# Patient Record
Sex: Female | Born: 1985 | Race: Black or African American | Hispanic: No | Marital: Single | Smoking: Never smoker
Health system: Southern US, Community
[De-identification: ages and names within clinical notes are randomized; demographics above are authoritative.]

## PROBLEM LIST (undated history)

## (undated) DIAGNOSIS — F419 Anxiety disorder, unspecified: Secondary | ICD-10-CM

## (undated) DIAGNOSIS — D649 Anemia, unspecified: Secondary | ICD-10-CM

## (undated) DIAGNOSIS — R51 Headache: Secondary | ICD-10-CM

## (undated) DIAGNOSIS — D759 Disease of blood and blood-forming organs, unspecified: Secondary | ICD-10-CM

## (undated) DIAGNOSIS — I10 Essential (primary) hypertension: Secondary | ICD-10-CM

## (undated) DIAGNOSIS — R519 Headache, unspecified: Secondary | ICD-10-CM

---

## 2006-06-18 ENCOUNTER — Emergency Department (HOSPITAL_COMMUNITY): Admission: EM | Admit: 2006-06-18 | Discharge: 2006-06-19 | Payer: Self-pay | Admitting: Emergency Medicine

## 2008-06-13 ENCOUNTER — Emergency Department (HOSPITAL_COMMUNITY): Admission: EM | Admit: 2008-06-13 | Discharge: 2008-06-13 | Payer: Self-pay | Admitting: Emergency Medicine

## 2009-10-15 ENCOUNTER — Emergency Department (HOSPITAL_COMMUNITY): Admission: EM | Admit: 2009-10-15 | Discharge: 2009-10-15 | Payer: Self-pay | Admitting: Emergency Medicine

## 2010-12-18 LAB — URINALYSIS, ROUTINE W REFLEX MICROSCOPIC
Glucose, UA: NEGATIVE mg/dL
Hgb urine dipstick: NEGATIVE
Specific Gravity, Urine: >1.03 — ABNORMAL HIGH (ref 1.005–1.030)

## 2010-12-18 LAB — GLUCOSE, CAPILLARY: Glucose-Capillary: 104 mg/dL — ABNORMAL HIGH (ref 70–99)

## 2010-12-18 LAB — RPR: RPR Ser Ql: NONREACTIVE

## 2010-12-18 LAB — WET PREP, GENITAL: Yeast Wet Prep HPF POC: NONE SEEN

## 2010-12-18 LAB — GC/CHLAMYDIA PROBE AMP, GENITAL
Chlamydia, DNA Probe: NEGATIVE
GC Probe Amp, Genital: NEGATIVE

## 2011-07-05 LAB — PREGNANCY, URINE: Preg Test, Ur: NEGATIVE

## 2011-07-05 LAB — URINALYSIS, ROUTINE W REFLEX MICROSCOPIC
Glucose, UA: NEGATIVE
Nitrite: NEGATIVE
Protein, ur: NEGATIVE

## 2011-07-05 LAB — GC/CHLAMYDIA PROBE AMP, GENITAL
Chlamydia, DNA Probe: NEGATIVE
GC Probe Amp, Genital: NEGATIVE

## 2011-07-05 LAB — WET PREP, GENITAL

## 2014-06-09 ENCOUNTER — Emergency Department (HOSPITAL_COMMUNITY)
Admission: EM | Admit: 2014-06-09 | Discharge: 2014-06-09 | Disposition: A | Discharge status: Home / Self Care | Payer: BC Managed Care – PPO | Attending: Emergency Medicine | Admitting: Emergency Medicine

## 2014-06-09 ENCOUNTER — Encounter (HOSPITAL_COMMUNITY): Payer: Self-pay | Admitting: Emergency Medicine

## 2014-06-09 DIAGNOSIS — S199XXA Unspecified injury of neck, initial encounter: Secondary | ICD-10-CM

## 2014-06-09 DIAGNOSIS — X58XXXA Exposure to other specified factors, initial encounter: Secondary | ICD-10-CM | POA: Insufficient documentation

## 2014-06-09 DIAGNOSIS — S0993XA Unspecified injury of face, initial encounter: Secondary | ICD-10-CM | POA: Insufficient documentation

## 2014-06-09 DIAGNOSIS — S01309A Unspecified open wound of unspecified ear, initial encounter: Secondary | ICD-10-CM | POA: Insufficient documentation

## 2014-06-09 DIAGNOSIS — Y9289 Other specified places as the place of occurrence of the external cause: Secondary | ICD-10-CM | POA: Insufficient documentation

## 2014-06-09 DIAGNOSIS — Y9389 Activity, other specified: Secondary | ICD-10-CM | POA: Diagnosis not present

## 2014-06-09 DIAGNOSIS — S0991XA Unspecified injury of ear, initial encounter: Secondary | ICD-10-CM

## 2014-06-09 NOTE — Discharge Instructions (Signed)
Please cleanse the left earlobe laceration site with soap and water. Please see a plastic surgeon soon for evaluation and management of the splint.

## 2014-06-09 NOTE — ED Provider Notes (Signed)
CSN: [id number]     Arrival date & time 06/09/14  1813 History   None    Chief Complaint  Patient presents with  . Ear Laceration     (Consider location/radiation/quality/duration/timing/severity/associated sxs/prior Treatment) HPI Comments: Pt reports the ear ring split the left ear lobe. This  Episode occurred today. Patient states she has had problems with her left ear in the past. She hasn't had a well-healed split area of the low in the past. Today she was wearing a heavy or hearing, and the split extended through the low. The patient states that she had mild bleeding present. This has now resolved. The patient presents to the emergency department to see what can be done concerning the splint in her earlobe.  The history is provided by the patient.    History reviewed. No pertinent past medical history. History reviewed. No pertinent past surgical history. History reviewed. No pertinent family history. History  Substance Use Topics  . Smoking status: Never Smoker   . Smokeless tobacco: Not on file  . Alcohol Use: No   OB History   Grav Para Term Preterm Abortions TAB SAB Ect Mult Living                 Review of Systems  Constitutional: Negative for activity change.       All ROS Neg except as noted in HPI  HENT: Negative for nosebleeds.   Eyes: Negative for photophobia and discharge.  Respiratory: Negative for cough, shortness of breath and wheezing.   Cardiovascular: Negative for chest pain and palpitations.  Gastrointestinal: Negative for abdominal pain and blood in stool.  Genitourinary: Negative for dysuria, frequency and hematuria.  Musculoskeletal: Negative for arthralgias, back pain and neck pain.  Skin: Negative.   Neurological: Negative for dizziness, seizures and speech difficulty.  Psychiatric/Behavioral: Negative for hallucinations and confusion.      Allergies  Review of patient's allergies indicates no known allergies.  Home Medications   Prior to  Admission medications   Not on File   BP 154/82  Pulse 80  Temp(Src) 99.1 F (37.3 C) (Oral)  Resp 18  Ht 5\' 4"  (1.626 m)  SpO2 100%  LMP 06/09/2014 Physical Exam  Nursing note and vitals reviewed. Constitutional: She is oriented to person, place, and time. She appears well-developed and well-nourished.  Non-toxic appearance.  HENT:  Head: Normocephalic.  Right Ear: Tympanic membrane and external ear normal.  Left Ear: Tympanic membrane and external ear normal.  Patient has a well-healed split in the left earlobe. The very small very thin area of tissue that was holding the split together has now been ruptured. There is a small scab present at the rupture site. There is no active bleeding.. There is no hematoma of the earlobe. There is no swollen glands of the preauricular or postauricular area of the ear.  Eyes: EOM and lids are normal. Pupils are equal, round, and reactive to light.  Neck: Normal range of motion. Neck supple. Carotid bruit is not present.  Cardiovascular: Normal rate, regular rhythm, normal heart sounds, intact distal pulses and normal pulses.   Pulmonary/Chest: Breath sounds normal. No respiratory distress.  Abdominal: Soft. Bowel sounds are normal. There is no tenderness. There is no guarding.  Musculoskeletal: Normal range of motion.  Lymphadenopathy:       Head (right side): No submandibular adenopathy present.       Head (left side): No submandibular adenopathy present.    She has no cervical adenopathy.  Neurological:  She is alert and oriented to person, place, and time. She has normal strength. No cranial nerve deficit or sensory deficit.  Skin: Skin is warm and dry.  Psychiatric: She has a normal mood and affect. Her speech is normal.        ED Course  Procedures (including critical care time) Labs Review Labs Reviewed - No data to display  Imaging Review No results found.   EKG Interpretation None      MDM  Patient had a split in the  earlobe in the past that had well healed. There was obviously a very thin area of tissue that was holding the split together and it ruptured today when the patient was wearing a heavy air pair of earrings.  I have suggested to the patient that she be seen by plastic surgery for possible repair of the lobe and to assist her with options concerning this injury. The patient is in agreement with this discharge plan.    Final diagnoses:  None    **I have reviewed nursing notes, vital signs, and all appropriate lab and imaging results for this patient.Lenox Ahr, PA-C 06/09/14 [place], PA-C 06/09/14 1926

## 2014-06-09 NOTE — ED Notes (Signed)
Pt states ear ring split left ear lobe. Bleeding is not present at this time. Nad.

## 2014-06-10 NOTE — ED Provider Notes (Signed)
Medical screening examination/treatment/procedure(s) were performed by non-physician practitioner and as supervising physician I was immediately available for consultation/collaboration.    Dorie Rank, MD 06/10/14 [phone]

## 2014-09-11 ENCOUNTER — Emergency Department (HOSPITAL_COMMUNITY)
Admission: EM | Admit: 2014-09-11 | Discharge: 2014-09-12 | Disposition: A | Discharge status: Home / Self Care | Payer: BC Managed Care – PPO | Attending: Emergency Medicine | Admitting: Emergency Medicine

## 2014-09-11 ENCOUNTER — Encounter (HOSPITAL_COMMUNITY): Payer: Self-pay | Admitting: Emergency Medicine

## 2014-09-11 ENCOUNTER — Emergency Department (HOSPITAL_COMMUNITY): Payer: BC Managed Care – PPO

## 2014-09-11 DIAGNOSIS — Z3202 Encounter for pregnancy test, result negative: Secondary | ICD-10-CM | POA: Diagnosis not present

## 2014-09-11 DIAGNOSIS — N8329 Other ovarian cysts: Secondary | ICD-10-CM | POA: Insufficient documentation

## 2014-09-11 DIAGNOSIS — Z720 Tobacco use: Secondary | ICD-10-CM | POA: Insufficient documentation

## 2014-09-11 DIAGNOSIS — R1030 Lower abdominal pain, unspecified: Secondary | ICD-10-CM | POA: Insufficient documentation

## 2014-09-11 DIAGNOSIS — N83209 Unspecified ovarian cyst, unspecified side: Secondary | ICD-10-CM

## 2014-09-11 DIAGNOSIS — R102 Pelvic and perineal pain: Secondary | ICD-10-CM | POA: Diagnosis present

## 2014-09-11 LAB — CBC WITH DIFFERENTIAL/PLATELET
Basophils Absolute: 0 10*3/uL (ref 0.0–0.1)
Basophils Relative: 0 % (ref 0–1)
Eosinophils Absolute: 0.3 10*3/uL (ref 0.0–0.7)
Eosinophils Relative: 3 % (ref 0–5)
HEMATOCRIT: 38.3 % (ref 36.0–46.0)
Hemoglobin: 12.8 g/dL (ref 12.0–15.0)
LYMPHS ABS: 3.4 10*3/uL (ref 0.7–4.0)
LYMPHS PCT: 37 % (ref 12–46)
MCH: 29.4 pg (ref 26.0–34.0)
MCHC: 33.4 g/dL (ref 30.0–36.0)
MCV: 87.8 fL (ref 78.0–100.0)
MONOS PCT: 7 % (ref 3–12)
Monocytes Absolute: 0.6 10*3/uL (ref 0.1–1.0)
NEUTROS ABS: 5 10*3/uL (ref 1.7–7.7)
Neutrophils Relative %: 53 % (ref 43–77)
PLATELETS: 304 10*3/uL (ref 150–400)
RBC: 4.36 MIL/uL (ref 3.87–5.11)
RDW: 13 % (ref 11.5–15.5)
WBC: 9.4 10*3/uL (ref 4.0–10.5)

## 2014-09-11 LAB — URINALYSIS, ROUTINE W REFLEX MICROSCOPIC
BILIRUBIN URINE: NEGATIVE
Glucose, UA: NEGATIVE mg/dL
Ketones, ur: NEGATIVE mg/dL
Leukocytes, UA: NEGATIVE
NITRITE: NEGATIVE
PROTEIN: NEGATIVE mg/dL
UROBILINOGEN UA: 0.2 mg/dL (ref 0.0–1.0)
pH: 6 (ref 5.0–8.0)

## 2014-09-11 LAB — COMPREHENSIVE METABOLIC PANEL
ALK PHOS: 110 U/L (ref 39–117)
ALT: 20 U/L (ref 0–35)
ANION GAP: 12 (ref 5–15)
AST: 19 U/L (ref 0–37)
Albumin: 3.2 g/dL — ABNORMAL LOW (ref 3.5–5.2)
BILIRUBIN TOTAL: 0.2 mg/dL — AB (ref 0.3–1.2)
BUN: 10 mg/dL (ref 6–23)
CHLORIDE: 103 meq/L (ref 96–112)
CO2: 23 meq/L (ref 19–32)
Calcium: 9.2 mg/dL (ref 8.4–10.5)
Creatinine, Ser: 1.03 mg/dL (ref 0.50–1.10)
GFR calc non Af Amer: 73 mL/min — ABNORMAL LOW (ref >90)
GFR, EST AFRICAN AMERICAN: 84 mL/min — AB (ref >90)
GLUCOSE: 96 mg/dL (ref 70–99)
POTASSIUM: 4 meq/L (ref 3.7–5.3)
Sodium: 138 mEq/L (ref 137–147)
Total Protein: 7.1 g/dL (ref 6.0–8.3)

## 2014-09-11 LAB — PREGNANCY, URINE: PREG TEST UR: NEGATIVE

## 2014-09-11 LAB — URINE MICROSCOPIC-ADD ON

## 2014-09-11 MED ORDER — HYDROCODONE-ACETAMINOPHEN 5-325 MG PO TABS: 1.0000 | ORAL_TABLET | ORAL | Status: DC | PRN

## 2014-09-11 MED ORDER — MORPHINE SULFATE 4 MG/ML IJ SOLN
6.0000 mg | Freq: Once | INTRAMUSCULAR | Status: AC
  Administered 2014-09-11: 6 mg via INTRAVENOUS
  Filled 2014-09-11: qty 2

## 2014-09-11 MED ORDER — IOHEXOL 300 MG/ML  SOLN
50.0000 mL | Freq: Once | INTRAMUSCULAR | Status: AC | PRN
  Administered 2014-09-11: 50 mL via ORAL

## 2014-09-11 MED ORDER — IOHEXOL 300 MG/ML  SOLN
100.0000 mL | Freq: Once | INTRAMUSCULAR | Status: AC | PRN
  Administered 2014-09-11: 100 mL via INTRAVENOUS

## 2014-09-11 MED ORDER — ONDANSETRON 8 MG PO TBDP: 8.0000 mg | ORAL_TABLET | Freq: Three times a day (TID) | ORAL | Status: DC | PRN

## 2014-09-11 NOTE — ED Notes (Signed)
Radiology called to call in ultrasound to rule out torsion.

## 2014-09-11 NOTE — ED Provider Notes (Signed)
CSN: [id number]     Arrival date & time 09/11/14  2009 History   First MD Initiated Contact with Patient 09/11/14 2037     Chief Complaint  Patient presents with  . Pelvic Pain      HPI Patient reports 5 days of lower abdominal pelvic pain.  No dysuria or urinary frequency.  Denies vaginal discharge.  Denies nausea vomiting and diarrhea.  Reports her pain is been constant.  She was seen by her gynecologist several days ago was told she had a urinary tract infection.  She continues to feel uncomfortable and thus she presents to the ER for evaluation.  She reports menstrual bleeding several days ago that seems to be lightening up at this time.   History reviewed. No pertinent past medical history. History reviewed. No pertinent past surgical history. History reviewed. No pertinent family history. History  Substance Use Topics  . Smoking status: Never Smoker   . Smokeless tobacco: Not on file  . Alcohol Use: No   OB History    No data available     Review of Systems  All other systems reviewed and are negative.     Allergies  Review of patient's allergies indicates no known allergies.  Home Medications   Prior to Admission medications   Medication Sig Start Date End Date Taking? Authorizing Provider  levofloxacin (LEVAQUIN) 500 MG tablet Take 500 mg by mouth daily. 7 days course starting 09/09/2014   Yes Historical Provider, MD   BP 137/89 mmHg  Pulse 97  Temp(Src) 98.5 F (36.9 C) (Oral)  Resp 18  Ht 5' 3.5" (1.613 m)  Wt 220 lb (99.791 kg)  BMI 38.36 kg/m2  SpO2 98%  LMP 09/08/2014 Physical Exam  Constitutional: She is oriented to person, place, and time. She appears well-developed and well-nourished. No distress.  HENT:  Head: Normocephalic and atraumatic.  Eyes: EOM are normal.  Neck: Normal range of motion.  Cardiovascular: Normal rate, regular rhythm and normal heart sounds.   Pulmonary/Chest: Effort normal and breath sounds normal.  Abdominal: Soft. She  exhibits no distension. There is no tenderness.  Musculoskeletal: Normal range of motion.  Neurological: She is alert and oriented to person, place, and time.  Skin: Skin is warm and dry.  Psychiatric: She has a normal mood and affect. Judgment normal.  Nursing note and vitals reviewed.   ED Course  Procedures (including critical care time) Labs Review Labs Reviewed  URINALYSIS, ROUTINE W REFLEX MICROSCOPIC - Abnormal; Notable for the following:    Specific Gravity, Urine >1.030 (*)    Hgb urine dipstick MODERATE (*)    All other components within normal limits  COMPREHENSIVE METABOLIC PANEL - Abnormal; Notable for the following:    Albumin 3.2 (*)    Total Bilirubin 0.2 (*)    GFR calc non Af Amer 73 (*)    GFR calc Af Amer 84 (*)    All other components within normal limits  PREGNANCY, URINE  CBC WITH DIFFERENTIAL  URINE MICROSCOPIC-ADD ON    Imaging Review No results found.   EKG Interpretation None      MDM   Final diagnoses:  Lower abdominal pain    Patient will undergo CT imaging of her lower abdomen given her ongoing lower abdominal pain for the past 4-5 days.  She has no vaginal complaints and no vaginal discharge and my suspicion for pelvic inflammatory disease is very low.  If her CT scan is normal the patient can follow-up closely with  her physicians.  Her urine appears to have cleared.  She will complete her course of Levaquin. Care to Dr Ashok Cordia to follow up on her CT imaging. Pt aware of discharge planning if CT negative    Hoy Morn, MD 09/11/14 2149

## 2014-09-11 NOTE — ED Notes (Signed)
Pt states lower abdominal/pelvic pain. Denies any increase in frequency or pain w/ urination. Pt denies any vaginal discharge, N/V/D. Last bowel movement was today.

## 2014-09-11 NOTE — ED Notes (Addendum)
Patient reports pelvic pain that started Sunday night. States was seen Tuesday and given medications for UTI. States symptoms have gotten worse. Denies vaginal discharge.

## 2014-09-12 ENCOUNTER — Emergency Department (HOSPITAL_COMMUNITY): Payer: BC Managed Care – PPO

## 2014-09-12 MED ORDER — HYDROCODONE-ACETAMINOPHEN 5-325 MG PO TABS: 1.0000 | ORAL_TABLET | ORAL | Status: DC | PRN

## 2014-09-12 NOTE — ED Provider Notes (Signed)
Signed out by Dr Venora Maples at 2200 to check ct when back.  Ct with 6.5 cm left ovarian cyst.  Pt notes worsening left pelvis pain in the past day, and constant for past few days. Will get u/s r/o torsion.  0030, u/s pending, signed out to Dr Roxanne Mins to check u/s when back and dispo appropriately.      Mirna Mires, MD 09/12/14 [phone]

## 2014-09-12 NOTE — Discharge Instructions (Signed)
Ovarian Cyst An ovarian cyst is a fluid-filled sac that forms on an ovary. The ovaries are small organs that produce eggs in women. Various types of cysts can form on the ovaries. Most are not cancerous. Many do not cause problems, and they often go away on their own. Some may cause symptoms and require treatment. Common types of ovarian cysts include:  Functional cysts--These cysts may occur every month during the menstrual cycle. This is normal. The cysts usually go away with the next menstrual cycle if the woman does not get pregnant. Usually, there are no symptoms with a functional cyst.  Endometrioma cysts--These cysts form from the tissue that lines the uterus. They are also called "chocolate cysts" because they become filled with blood that turns brown. This type of cyst can cause pain in the lower abdomen during intercourse and with your menstrual period.  Cystadenoma cysts--This type develops from the cells on the outside of the ovary. These cysts can get very big and cause lower abdomen pain and pain with intercourse. This type of cyst can twist on itself, cut off its blood supply, and cause severe pain. It can also easily rupture and cause a lot of pain.  Dermoid cysts--This type of cyst is sometimes found in both ovaries. These cysts may contain different kinds of body tissue, such as skin, teeth, hair, or cartilage. They usually do not cause symptoms unless they get very big.  Theca lutein cysts--These cysts occur when too much of a certain hormone (human chorionic gonadotropin) is produced and overstimulates the ovaries to produce an egg. This is most common after procedures used to assist with the conception of a baby (in vitro fertilization). CAUSES   Fertility drugs can cause a condition in which multiple large cysts are formed on the ovaries. This is called ovarian hyperstimulation syndrome.  A condition called polycystic ovary syndrome can cause hormonal imbalances that can lead to  nonfunctional ovarian cysts. SIGNS AND SYMPTOMS  Many ovarian cysts do not cause symptoms. If symptoms are present, they may include:  Pelvic pain or pressure.  Pain in the lower abdomen.  Pain during sexual intercourse.  Increasing girth (swelling) of the abdomen.  Abnormal menstrual periods.  Increasing pain with menstrual periods.  Stopping having menstrual periods without being pregnant. DIAGNOSIS  These cysts are commonly found during a routine or annual pelvic exam. Tests may be ordered to find out more about the cyst. These tests may include:  Ultrasound.  X-ray of the pelvis.  CT scan.  MRI.  Blood tests. TREATMENT  Many ovarian cysts go away on their own without treatment. Your health care provider may want to check your cyst regularly for 2-3 months to see if it changes. For women in menopause, it is particularly important to monitor a cyst closely because of the higher rate of ovarian cancer in menopausal women. When treatment is needed, it may include any of the following:  A procedure to drain the cyst (aspiration). This may be done using a long needle and ultrasound. It can also be done through a laparoscopic procedure. This involves using a thin, lighted tube with a tiny camera on the end (laparoscope) inserted through a small incision.  Surgery to remove the whole cyst. This may be done using laparoscopic surgery or an open surgery involving a larger incision in the lower abdomen.  Hormone treatment or birth control pills. These methods are sometimes used to help dissolve a cyst. HOME CARE INSTRUCTIONS   Only take over-the-counter   or prescription medicines as directed by your health care provider.  Follow up with your health care provider as directed.  Get regular pelvic exams and Pap tests. SEEK MEDICAL CARE IF:   Your periods are late, irregular, or painful, or they stop.  Your pelvic pain or abdominal pain does not go away.  Your abdomen becomes  larger or swollen.  You have pressure on your bladder or trouble emptying your bladder completely.  You have pain during sexual intercourse.  You have feelings of fullness, pressure, or discomfort in your stomach.  You lose weight for no apparent reason.  You feel generally ill.  You become constipated.  You lose your appetite.  You develop acne.  You have an increase in body and facial hair.  You are gaining weight, without changing your exercise and eating habits.  You think you are pregnant. SEEK IMMEDIATE MEDICAL CARE IF:   You have increasing abdominal pain.  You feel sick to your stomach (nauseous), and you throw up (vomit).  You develop a fever that comes on suddenly.  You have abdominal pain during a bowel movement.  Your menstrual periods become heavier than usual. MAKE SURE YOU:  Understand these instructions.  Will watch your condition.  Will get help right away if you are not doing well or get worse. Document Released: 09/18/2005 Document Revised: 09/23/2013 Document Reviewed: 05/26/2013 Exit[employer] Patient Information 2015 [employer]. This information is not intended to replace advice given to you by your health care provider. Make sure you discuss any questions you have with your health care provider.  

## 2014-09-12 NOTE — ED Provider Notes (Signed)
Patient initially seen by Dr. Venora Maples, signed out to me by Dr. spinal to evaluate pelvic ultrasound. CT had shown left ovarian mass, probable cyst. Ultrasound did not show the ovaries at all. Patient is referred back to her gynecologist for further evaluation. She is discharged with prescription for hydrocodone-acetaminophen for pain.  Mary Fuel, MD 14/23/95 3202

## 2014-09-28 MED FILL — Hydrocodone-Acetaminophen Tab 5-325 MG: ORAL | Qty: 6 | Status: AC

## 2016-02-09 ENCOUNTER — Emergency Department (HOSPITAL_COMMUNITY)
Admission: EM | Admit: 2016-02-09 | Discharge: 2016-02-09 | Disposition: A | Discharge status: Home / Self Care | Payer: BLUE CROSS/BLUE SHIELD | Attending: Emergency Medicine | Admitting: Emergency Medicine

## 2016-02-09 ENCOUNTER — Encounter (HOSPITAL_COMMUNITY): Payer: Self-pay | Admitting: Emergency Medicine

## 2016-02-09 DIAGNOSIS — S161XXA Strain of muscle, fascia and tendon at neck level, initial encounter: Secondary | ICD-10-CM | POA: Diagnosis not present

## 2016-02-09 DIAGNOSIS — Y999 Unspecified external cause status: Secondary | ICD-10-CM | POA: Diagnosis not present

## 2016-02-09 DIAGNOSIS — Y92410 Unspecified street and highway as the place of occurrence of the external cause: Secondary | ICD-10-CM | POA: Insufficient documentation

## 2016-02-09 DIAGNOSIS — Y939 Activity, unspecified: Secondary | ICD-10-CM | POA: Insufficient documentation

## 2016-02-09 DIAGNOSIS — Z79891 Long term (current) use of opiate analgesic: Secondary | ICD-10-CM | POA: Insufficient documentation

## 2016-02-09 DIAGNOSIS — Z79899 Other long term (current) drug therapy: Secondary | ICD-10-CM | POA: Diagnosis not present

## 2016-02-09 DIAGNOSIS — Z792 Long term (current) use of antibiotics: Secondary | ICD-10-CM | POA: Insufficient documentation

## 2016-02-09 DIAGNOSIS — Z791 Long term (current) use of non-steroidal anti-inflammatories (NSAID): Secondary | ICD-10-CM | POA: Diagnosis not present

## 2016-02-09 DIAGNOSIS — S199XXA Unspecified injury of neck, initial encounter: Secondary | ICD-10-CM | POA: Diagnosis not present

## 2016-02-09 MED ORDER — NAPROXEN 500 MG PO TABS
500.0000 mg | ORAL_TABLET | Freq: Once | ORAL | Status: AC
Start: 2016-02-09 — End: 2016-02-09
  Administered 2016-02-09: 500 mg via ORAL
  Filled 2016-02-09: qty 1

## 2016-02-09 MED ORDER — NAPROXEN 500 MG PO TABS: 500.0000 mg | ORAL_TABLET | Freq: Two times a day (BID) | ORAL | Status: DC

## 2016-02-09 MED ORDER — CYCLOBENZAPRINE HCL 10 MG PO TABS: 10.0000 mg | ORAL_TABLET | Freq: Two times a day (BID) | ORAL | Status: DC | PRN
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
## 2016-02-09 NOTE — Discharge Instructions (Signed)
Cervical Strain and Sprain With Rehab  Cervical strain and sprain are injuries that commonly occur with "whiplash" injuries. Whiplash occurs when the neck is forcefully whipped backward or forward, such as during a motor vehicle accident or during contact sports. The muscles, ligaments, tendons, discs, and nerves of the neck are susceptible to injury when this occurs.  RISK FACTORS  Risk of having a whiplash injury increases if:  · Osteoarthritis of the spine.  · Situations that make head or neck accidents or trauma more likely.  · High-risk sports (football, rugby, wrestling, hockey, auto racing, gymnastics, diving, contact karate, or boxing).  · Poor strength and flexibility of the neck.  · Previous neck injury.  · Poor tackling technique.  · Improperly fitted or padded equipment.  SYMPTOMS   · Pain or stiffness in the front or back of neck or both.  · Symptoms may present immediately or up to 24 hours after injury.  · Dizziness, headache, nausea, and vomiting.  · Muscle spasm with soreness and stiffness in the neck.  · Tenderness and swelling at the injury site.  PREVENTION  · Learn and use proper technique (avoid tackling with the head, spearing, and head-butting; use proper falling techniques to avoid landing on the head).  · Warm up and stretch properly before activity.  · Maintain physical fitness:    Strength, flexibility, and endurance.    Cardiovascular fitness.  · Wear properly fitted and padded protective equipment, such as padded soft collars, for participation in contact sports.  PROGNOSIS   Recovery from cervical strain and sprain injuries is dependent on the extent of the injury. These injuries are usually curable in 1 week to 3 months with appropriate treatment.   RELATED COMPLICATIONS   · Temporary numbness and weakness may occur if the nerve roots are damaged, and this may persist until the nerve has completely healed.  · Chronic pain due to frequent recurrence of symptoms.  · Prolonged healing,  especially if activity is resumed too soon (before complete recovery).  TREATMENT   Treatment initially involves the use of ice and medication to help reduce pain and inflammation. It is also important to perform strengthening and stretching exercises and modify activities that worsen symptoms so the injury does not get worse. These exercises may be performed at home or with a therapist. For patients who experience severe symptoms, a soft, padded collar may be recommended to be worn around the neck.   Improving your posture may help reduce symptoms. Posture improvement includes pulling your chin and abdomen in while sitting or standing. If you are sitting, sit in a firm chair with your buttocks against the back of the chair. While sleeping, try replacing your pillow with a small towel rolled to 2 inches in diameter, or use a cervical pillow or soft cervical collar. Poor sleeping positions delay healing.   For patients with nerve root damage, which causes numbness or weakness, the use of a cervical traction apparatus may be recommended. Surgery is rarely necessary for these injuries. However, cervical strain and sprains that are present at birth (congenital) may require surgery.  MEDICATION   · If pain medication is necessary, nonsteroidal anti-inflammatory medications, such as aspirin and ibuprofen, or other minor pain relievers, such as acetaminophen, are often recommended.  · Do not take pain medication for 7 days before surgery.  · Prescription pain relievers may be given if deemed necessary by your caregiver. Use only as directed and only as much as you need.    HEAT AND COLD:   · Cold treatment (icing) relieves pain and reduces inflammation. Cold treatment should be applied for 10 to 15 minutes every 2 to 3 hours for inflammation and pain and immediately after any activity that aggravates your symptoms. Use ice packs or an ice massage.  · Heat treatment may be used prior to performing the stretching and  strengthening activities prescribed by your caregiver, physical therapist, or athletic trainer. Use a heat pack or a warm soak.  SEEK MEDICAL CARE IF:   · Symptoms get worse or do not improve in 2 weeks despite treatment.  · New, unexplained symptoms develop (drugs used in treatment may produce side effects).  EXERCISES  RANGE OF MOTION (ROM) AND STRETCHING EXERCISES - Cervical Strain and Sprain  These exercises may help you when beginning to rehabilitate your injury. In order to successfully resolve your symptoms, you must improve your posture. These exercises are designed to help reduce the forward-head and rounded-shoulder posture which contributes to this condition. Your symptoms may resolve with or without further involvement from your physician, physical therapist or athletic trainer. While completing these exercises, remember:   · Restoring tissue flexibility helps normal motion to return to the joints. This allows healthier, less painful movement and activity.  · An effective stretch should be held for at least 20 seconds, although you may need to begin with shorter hold times for comfort.  · A stretch should never be painful. You should only feel a gentle lengthening or release in the stretched tissue.  STRETCH- Axial Extensors  · Lie on your back on the floor. You may bend your knees for comfort. Place a rolled-up hand towel or dish towel, about 2 inches in diameter, under the part of your head that makes contact with the floor.  · Gently tuck your chin, as if trying to make a "double chin," until you feel a gentle stretch at the base of your head.  · Hold __________ seconds.  Repeat __________ times. Complete this exercise __________ times per day.   STRETCH - Axial Extension   · Stand or sit on a firm surface. Assume a good posture: chest up, shoulders drawn back, abdominal muscles slightly tense, knees unlocked (if standing) and feet hip width apart.  · Slowly retract your chin so your head slides back  and your chin slightly lowers. Continue to look straight ahead.  · You should feel a gentle stretch in the back of your head. Be certain not to feel an aggressive stretch since this can cause headaches later.  · Hold for __________ seconds.  Repeat __________ times. Complete this exercise __________ times per day.  STRETCH - Cervical Side Bend   · Stand or sit on a firm surface. Assume a good posture: chest up, shoulders drawn back, abdominal muscles slightly tense, knees unlocked (if standing) and feet hip width apart.  · Without letting your nose or shoulders move, slowly tip your right / left ear to your shoulder until your feel a gentle stretch in the muscles on the opposite side of your neck.  · Hold __________ seconds.  Repeat __________ times. Complete this exercise __________ times per day.  STRETCH - Cervical Rotators   · Stand or sit on a firm surface. Assume a good posture: chest up, shoulders drawn back, abdominal muscles slightly tense, knees unlocked (if standing) and feet hip width apart.  · Keeping your eyes level with the ground, slowly turn your head until you feel a gentle stretch along   the back and opposite side of your neck.  · Hold __________ seconds.  Repeat __________ times. Complete this exercise __________ times per day.  RANGE OF MOTION - Neck Circles   · Stand or sit on a firm surface. Assume a good posture: chest up, shoulders drawn back, abdominal muscles slightly tense, knees unlocked (if standing) and feet hip width apart.  · Gently roll your head down and around from the back of one shoulder to the back of the other. The motion should never be forced or painful.  · Repeat the motion 10-20 times, or until you feel the neck muscles relax and loosen.  Repeat __________ times. Complete the exercise __________ times per day.  STRENGTHENING EXERCISES - Cervical Strain and Sprain  These exercises may help you when beginning to rehabilitate your injury. They may resolve your symptoms with or  without further involvement from your physician, physical therapist, or athletic trainer. While completing these exercises, remember:   · Muscles can gain both the endurance and the strength needed for everyday activities through controlled exercises.  · Complete these exercises as instructed by your physician, physical therapist, or athletic trainer. Progress the resistance and repetitions only as guided.  · You may experience muscle soreness or fatigue, but the pain or discomfort you are trying to eliminate should never worsen during these exercises. If this pain does worsen, stop and make certain you are following the directions exactly. If the pain is still present after adjustments, discontinue the exercise until you can discuss the trouble with your clinician.  STRENGTH - Cervical Flexors, Isometric  · Face a wall, standing about 6 inches away. Place a small pillow, a ball about 6-8 inches in diameter, or a folded towel between your forehead and the wall.  · Slightly tuck your chin and gently push your forehead into the soft object. Push only with mild to moderate intensity, building up tension gradually. Keep your jaw and forehead relaxed.  · Hold 10 to 20 seconds. Keep your breathing relaxed.  · Release the tension slowly. Relax your neck muscles completely before you start the next repetition.  Repeat __________ times. Complete this exercise __________ times per day.  STRENGTH- Cervical Lateral Flexors, Isometric   · Stand about 6 inches away from a wall. Place a small pillow, a ball about 6-8 inches in diameter, or a folded towel between the side of your head and the wall.  · Slightly tuck your chin and gently tilt your head into the soft object. Push only with mild to moderate intensity, building up tension gradually. Keep your jaw and forehead relaxed.  · Hold 10 to 20 seconds. Keep your breathing relaxed.  · Release the tension slowly. Relax your neck muscles completely before you start the next  repetition.  Repeat __________ times. Complete this exercise __________ times per day.  STRENGTH - Cervical Extensors, Isometric   · Stand about 6 inches away from a wall. Place a small pillow, a ball about 6-8 inches in diameter, or a folded towel between the back of your head and the wall.  · Slightly tuck your chin and gently tilt your head back into the soft object. Push only with mild to moderate intensity, building up tension gradually. Keep your jaw and forehead relaxed.  · Hold 10 to 20 seconds. Keep your breathing relaxed.  · Release the tension slowly. Relax your neck muscles completely before you start the next repetition.  Repeat __________ times. Complete this exercise __________ times per day.    POSTURE AND BODY MECHANICS CONSIDERATIONS - Cervical Strain and Sprain  Keeping correct posture when sitting, standing or completing your activities will reduce the stress put on different body tissues, allowing injured tissues a chance to heal and limiting painful experiences. The following are general guidelines for improved posture. Your physician or physical therapist will provide you with any instructions specific to your needs. While reading these guidelines, remember:  · The exercises prescribed by your provider will help you have the flexibility and strength to maintain correct postures.  · The correct posture provides the optimal environment for your joints to work. All of your joints have less wear and tear when properly supported by a spine with good posture. This means you will experience a healthier, less painful body.  · Correct posture must be practiced with all of your activities, especially prolonged sitting and standing. Correct posture is as important when doing repetitive low-stress activities (typing) as it is when doing a single heavy-load activity (lifting).  PROLONGED STANDING WHILE SLIGHTLY LEANING FORWARD  When completing a task that requires you to lean forward while standing in one  place for a long time, place either foot up on a stationary 2- to 4-inch high object to help maintain the best posture. When both feet are on the ground, the low back tends to lose its slight inward curve. If this curve flattens (or becomes too large), then the back and your other joints will experience too much stress, fatigue more quickly, and can cause pain.   RESTING POSITIONS  Consider which positions are most painful for you when choosing a resting position. If you have pain with flexion-based activities (sitting, bending, stooping, squatting), choose a position that allows you to rest in a less flexed posture. You would want to avoid curling into a fetal position on your side. If your pain worsens with extension-based activities (prolonged standing, working overhead), avoid resting in an extended position such as sleeping on your stomach. Most people will find more comfort when they rest with their spine in a more neutral position, neither too rounded nor too arched. Lying on a non-sagging bed on your side with a pillow between your knees, or on your back with a pillow under your knees will often provide some relief. Keep in mind, being in any one position for a prolonged period of time, no matter how correct your posture, can still lead to stiffness.  WALKING  Walk with an upright posture. Your ears, shoulders, and hips should all line up.  OFFICE WORK  When working at a desk, create an environment that supports good, upright posture. Without extra support, muscles fatigue and lead to excessive strain on joints and other tissues.  CHAIR:  · A chair should be able to slide under your desk when your back makes contact with the back of the chair. This allows you to work closely.  · The chair's height should allow your eyes to be level with the upper part of your monitor and your hands to be slightly lower than your elbows.  · Body position:    Your feet should make contact with the floor. If this is not  possible, use a foot rest.    Keep your ears over your shoulders. This will reduce stress on your neck and low back.     This information is not intended to replace advice given to you by your health care provider. Make sure you discuss any questions you have with your health care provider.

## 2016-05-26 DIAGNOSIS — E669 Obesity, unspecified: Secondary | ICD-10-CM | POA: Diagnosis not present

## 2016-05-26 DIAGNOSIS — H6123 Impacted cerumen, bilateral: Secondary | ICD-10-CM | POA: Diagnosis not present

## 2016-05-26 DIAGNOSIS — Z0001 Encounter for general adult medical examination with abnormal findings: Secondary | ICD-10-CM | POA: Diagnosis not present

## 2016-05-26 DIAGNOSIS — Z1389 Encounter for screening for other disorder: Secondary | ICD-10-CM | POA: Diagnosis not present

## 2016-05-26 DIAGNOSIS — Z6841 Body Mass Index (BMI) 40.0 and over, adult: Secondary | ICD-10-CM | POA: Diagnosis not present

## 2016-05-26 DIAGNOSIS — Z0000 Encounter for general adult medical examination without abnormal findings: Secondary | ICD-10-CM | POA: Diagnosis not present

## 2016-05-26 DIAGNOSIS — D259 Leiomyoma of uterus, unspecified: Secondary | ICD-10-CM | POA: Diagnosis not present

## 2016-10-05 DIAGNOSIS — K219 Gastro-esophageal reflux disease without esophagitis: Secondary | ICD-10-CM | POA: Diagnosis not present

## 2016-10-05 DIAGNOSIS — Z6841 Body Mass Index (BMI) 40.0 and over, adult: Secondary | ICD-10-CM | POA: Diagnosis not present

## 2016-10-05 DIAGNOSIS — Z1389 Encounter for screening for other disorder: Secondary | ICD-10-CM | POA: Diagnosis not present

## 2016-10-11 DIAGNOSIS — Z01419 Encounter for gynecological examination (general) (routine) without abnormal findings: Secondary | ICD-10-CM | POA: Diagnosis not present

## 2016-10-11 DIAGNOSIS — Z6841 Body Mass Index (BMI) 40.0 and over, adult: Secondary | ICD-10-CM | POA: Diagnosis not present

## 2016-10-11 DIAGNOSIS — Z113 Encounter for screening for infections with a predominantly sexual mode of transmission: Secondary | ICD-10-CM | POA: Diagnosis not present

## 2016-10-11 DIAGNOSIS — N760 Acute vaginitis: Secondary | ICD-10-CM | POA: Diagnosis not present

## 2016-10-17 DIAGNOSIS — R140 Abdominal distension (gaseous): Secondary | ICD-10-CM | POA: Diagnosis not present

## 2016-10-17 DIAGNOSIS — N852 Hypertrophy of uterus: Secondary | ICD-10-CM | POA: Diagnosis not present

## 2016-10-17 DIAGNOSIS — N979 Female infertility, unspecified: Secondary | ICD-10-CM | POA: Diagnosis not present

## 2016-10-17 DIAGNOSIS — D252 Subserosal leiomyoma of uterus: Secondary | ICD-10-CM | POA: Diagnosis not present

## 2016-10-17 DIAGNOSIS — R102 Pelvic and perineal pain: Secondary | ICD-10-CM | POA: Diagnosis not present

## 2016-10-23 DIAGNOSIS — N979 Female infertility, unspecified: Secondary | ICD-10-CM | POA: Diagnosis not present

## 2016-11-03 DIAGNOSIS — N979 Female infertility, unspecified: Secondary | ICD-10-CM | POA: Diagnosis not present

## 2016-11-03 DIAGNOSIS — N939 Abnormal uterine and vaginal bleeding, unspecified: Secondary | ICD-10-CM | POA: Diagnosis not present

## 2017-11-28 DIAGNOSIS — D259 Leiomyoma of uterus, unspecified: Secondary | ICD-10-CM | POA: Diagnosis not present

## 2018-05-30 ENCOUNTER — Other Ambulatory Visit: Payer: Self-pay | Admitting: Obstetrics and Gynecology

## 2018-06-04 NOTE — Patient Instructions (Signed)
Your procedure is scheduled on: Monday June 17, 2018 at 1:00 pm  Enter through the Main Entrance of [facility] at: 11:30 am  Pick up the phone at the desk and dial [phone].  Call this number if you have problems the morning of surgery: [phone].  Remember: Do NOT eat food: after Midnight on Sunday September 15 Do NOT drink clear liquids after: 7:00 am day of surgery Take these medicines the morning of surgery with a SIP OF WATER:  STOP ALL VITAMINS, SUPPLEMENTS, HERBAL MEDICATIONS NOW  DO NOT SMOKE DAY OF SURGERY  BRUSH YOUR TEETH DAY OF SURGERY  Do NOT wear jewelry (body piercing), metal hair clips/bobby pins, make-up, or nail polish. Do NOT wear lotions, powders, or perfumes.  You may wear deoderant. Do NOT shave for 48 hours prior to surgery. Do NOT bring valuables to the hospital. Contacts, dentures, or bridgework may not be worn into surgery. Leave suitcase in car.  After surgery it may be brought to your room.    For patients admitted to the hospital, checkout time is 11:00 AM the day of discharge.

## 2018-06-05 ENCOUNTER — Encounter (HOSPITAL_COMMUNITY)
Admission: RE | Admit: 2018-06-05 | Discharge: 2018-06-05 | Disposition: A | Discharge status: Home / Self Care | Payer: BLUE CROSS/BLUE SHIELD | Source: Ambulatory Visit | Attending: Obstetrics and Gynecology | Admitting: Obstetrics and Gynecology

## 2018-06-05 ENCOUNTER — Other Ambulatory Visit: Payer: Self-pay

## 2018-06-05 ENCOUNTER — Encounter (HOSPITAL_COMMUNITY): Payer: Self-pay

## 2018-06-05 DIAGNOSIS — D219 Benign neoplasm of connective and other soft tissue, unspecified: Secondary | ICD-10-CM | POA: Diagnosis not present

## 2018-06-05 DIAGNOSIS — Z01818 Encounter for other preprocedural examination: Secondary | ICD-10-CM | POA: Diagnosis not present

## 2018-06-05 HISTORY — DX: Headache, unspecified: R51.9

## 2018-06-05 HISTORY — DX: Essential (primary) hypertension: I10

## 2018-06-05 HISTORY — DX: Disease of blood and blood-forming organs, unspecified: D75.9

## 2018-06-05 HISTORY — DX: Anemia, unspecified: D64.9

## 2018-06-05 HISTORY — DX: Anxiety disorder, unspecified: F41.9

## 2018-06-05 HISTORY — DX: Headache: R51

## 2018-06-05 LAB — BASIC METABOLIC PANEL
ANION GAP: 10 (ref 5–15)
BUN: 13 mg/dL (ref 6–20)
CHLORIDE: 103 mmol/L (ref 98–111)
CO2: 22 mmol/L (ref 22–32)
Calcium: 9.2 mg/dL (ref 8.9–10.3)
Creatinine, Ser: 0.86 mg/dL (ref 0.44–1.00)
GFR calc Af Amer: >60 mL/min (ref >60)
GFR calc non Af Amer: >60 mL/min (ref >60)
Glucose, Bld: 88 mg/dL (ref 70–99)
POTASSIUM: 3.9 mmol/L (ref 3.5–5.1)
SODIUM: 135 mmol/L (ref 135–145)

## 2018-06-05 LAB — CBC
HEMATOCRIT: 40.4 % (ref 36.0–46.0)
HEMOGLOBIN: 13.7 g/dL (ref 12.0–15.0)
MCH: 29.8 pg (ref 26.0–34.0)
MCHC: 33.9 g/dL (ref 30.0–36.0)
MCV: 87.8 fL (ref 78.0–100.0)
Platelets: 261 10*3/uL (ref 150–400)
RBC: 4.6 MIL/uL (ref 3.87–5.11)
RDW: 13.1 % (ref 11.5–15.5)
WBC: 6.8 10*3/uL (ref 4.0–10.5)

## 2018-06-06 DIAGNOSIS — D259 Leiomyoma of uterus, unspecified: Secondary | ICD-10-CM | POA: Diagnosis not present

## 2018-06-17 ENCOUNTER — Inpatient Hospital Stay (HOSPITAL_COMMUNITY): Payer: Commercial Managed Care - PPO | Admitting: Anesthesiology

## 2018-06-17 ENCOUNTER — Encounter (HOSPITAL_COMMUNITY): Payer: Self-pay

## 2018-06-17 ENCOUNTER — Other Ambulatory Visit: Payer: Self-pay

## 2018-06-17 ENCOUNTER — Encounter (HOSPITAL_COMMUNITY)
Admission: RE | Disposition: A | Discharge status: Home / Self Care | Payer: Self-pay | Source: Home / Self Care | Attending: Obstetrics and Gynecology

## 2018-06-17 ENCOUNTER — Inpatient Hospital Stay (HOSPITAL_COMMUNITY)
Admission: RE | Admit: 2018-06-17 | Discharge: 2018-06-19 | DRG: 743 | Disposition: A | Discharge status: Home / Self Care | Payer: Commercial Managed Care - PPO | Attending: Obstetrics and Gynecology | Admitting: Obstetrics and Gynecology

## 2018-06-17 DIAGNOSIS — D259 Leiomyoma of uterus, unspecified: Secondary | ICD-10-CM | POA: Diagnosis not present

## 2018-06-17 DIAGNOSIS — Z23 Encounter for immunization: Secondary | ICD-10-CM | POA: Diagnosis not present

## 2018-06-17 DIAGNOSIS — D252 Subserosal leiomyoma of uterus: Secondary | ICD-10-CM | POA: Diagnosis not present

## 2018-06-17 DIAGNOSIS — N946 Dysmenorrhea, unspecified: Secondary | ICD-10-CM | POA: Diagnosis not present

## 2018-06-17 DIAGNOSIS — D219 Benign neoplasm of connective and other soft tissue, unspecified: Secondary | ICD-10-CM | POA: Diagnosis present

## 2018-06-17 HISTORY — PX: MYOMECTOMY: SHX85

## 2018-06-17 LAB — ABO/RH: ABO/RH(D): O POS

## 2018-06-17 LAB — HCG, SERUM, QUALITATIVE: PREG SERUM: NEGATIVE

## 2018-06-17 LAB — TYPE AND SCREEN
ABO/RH(D): O POS
ANTIBODY SCREEN: NEGATIVE

## 2018-06-17 SURGERY — MYOMECTOMY, ABDOMINAL APPROACH
Anesthesia: General | Site: Abdomen

## 2018-06-17 MED ORDER — ONDANSETRON HCL 4 MG/2ML IJ SOLN
4.0000 mg | Freq: Four times a day (QID) | INTRAMUSCULAR | Status: DC | PRN
  Administered 2018-06-17: 4 mg via INTRAVENOUS
  Filled 2018-06-17: qty 2

## 2018-06-17 MED ORDER — ONDANSETRON HCL 4 MG/2ML IJ SOLN
INTRAMUSCULAR | Status: DC | PRN
  Administered 2018-06-17: 4 mg via INTRAVENOUS

## 2018-06-17 MED ORDER — MIDAZOLAM HCL 5 MG/5ML IJ SOLN
INTRAMUSCULAR | Status: DC | PRN
  Administered 2018-06-17: 2 mg via INTRAVENOUS

## 2018-06-17 MED ORDER — ONDANSETRON HCL 4 MG/2ML IJ SOLN
INTRAMUSCULAR | Status: AC
  Filled 2018-06-17: qty 2

## 2018-06-17 MED ORDER — DEXTROSE IN LACTATED RINGERS 5 % IV SOLN: INTRAVENOUS | Status: DC

## 2018-06-17 MED ORDER — SODIUM CHLORIDE 0.9% FLUSH: 9.0000 mL | INTRAVENOUS | Status: DC | PRN

## 2018-06-17 MED ORDER — PROMETHAZINE HCL 25 MG/ML IJ SOLN: 6.2500 mg | INTRAMUSCULAR | Status: DC | PRN

## 2018-06-17 MED ORDER — PROPOFOL 10 MG/ML IV BOLUS
INTRAVENOUS | Status: DC | PRN
  Administered 2018-06-17: 200 mg via INTRAVENOUS

## 2018-06-17 MED ORDER — MEPERIDINE HCL 25 MG/ML IJ SOLN: 6.2500 mg | INTRAMUSCULAR | Status: DC | PRN

## 2018-06-17 MED ORDER — DIPHENHYDRAMINE HCL 50 MG/ML IJ SOLN: 12.5000 mg | Freq: Four times a day (QID) | INTRAMUSCULAR | Status: DC | PRN

## 2018-06-17 MED ORDER — DIPHENHYDRAMINE HCL 12.5 MG/5ML PO ELIX: 12.5000 mg | ORAL_SOLUTION | Freq: Four times a day (QID) | ORAL | Status: DC | PRN

## 2018-06-17 MED ORDER — ROCURONIUM BROMIDE 100 MG/10ML IV SOLN
INTRAVENOUS | Status: AC
  Filled 2018-06-17: qty 1

## 2018-06-17 MED ORDER — TRAMADOL HCL 50 MG PO TABS
50.0000 mg | ORAL_TABLET | Freq: Four times a day (QID) | ORAL | Status: DC | PRN
  Administered 2018-06-18 – 2018-06-19 (×4): 50 mg via ORAL
  Filled 2018-06-17 (×5): qty 1

## 2018-06-17 MED ORDER — LIDOCAINE HCL (CARDIAC) PF 100 MG/5ML IV SOSY
PREFILLED_SYRINGE | INTRAVENOUS | Status: DC | PRN
  Administered 2018-06-17: 100 mg via INTRAVENOUS

## 2018-06-17 MED ORDER — PROPOFOL 10 MG/ML IV BOLUS
INTRAVENOUS | Status: AC
  Filled 2018-06-17: qty 20

## 2018-06-17 MED ORDER — SUGAMMADEX SODIUM 200 MG/2ML IV SOLN
INTRAVENOUS | Status: DC | PRN
  Administered 2018-06-17: 220 mg via INTRAVENOUS

## 2018-06-17 MED ORDER — OXYCODONE HCL 5 MG PO TABS: 5.0000 mg | ORAL_TABLET | Freq: Once | ORAL | Status: DC | PRN

## 2018-06-17 MED ORDER — BUPIVACAINE HCL (PF) 0.25 % IJ SOLN
INTRAMUSCULAR | Status: AC
  Filled 2018-06-17: qty 30

## 2018-06-17 MED ORDER — VASOPRESSIN 20 UNIT/ML IV SOLN
INTRAVENOUS | Status: AC
  Filled 2018-06-17: qty 1

## 2018-06-17 MED ORDER — HYDROMORPHONE 1 MG/ML IV SOLN
INTRAVENOUS | Status: DC
  Administered 2018-06-17: 30 mg via INTRAVENOUS
  Filled 2018-06-17: qty 30

## 2018-06-17 MED ORDER — SCOPOLAMINE 1 MG/3DAYS TD PT72
MEDICATED_PATCH | TRANSDERMAL | Status: AC
  Filled 2018-06-17: qty 1

## 2018-06-17 MED ORDER — DEXAMETHASONE SODIUM PHOSPHATE 10 MG/ML IJ SOLN
INTRAMUSCULAR | Status: AC
  Filled 2018-06-17: qty 1

## 2018-06-17 MED ORDER — FENTANYL CITRATE (PF) 100 MCG/2ML IJ SOLN
INTRAMUSCULAR | Status: DC | PRN
  Administered 2018-06-17 (×2): 100 ug via INTRAVENOUS
  Administered 2018-06-17: 50 ug via INTRAVENOUS

## 2018-06-17 MED ORDER — HYDROMORPHONE HCL 1 MG/ML IJ SOLN
INTRAMUSCULAR | Status: AC
  Filled 2018-06-17: qty 1

## 2018-06-17 MED ORDER — BUPIVACAINE HCL (PF) 0.25 % IJ SOLN
INTRAMUSCULAR | Status: DC | PRN
  Administered 2018-06-17: 10 mL

## 2018-06-17 MED ORDER — VASOPRESSIN 20 UNIT/ML IV SOLN
INTRAVENOUS | Status: DC | PRN
  Administered 2018-06-17: 44 mL via INTRAMUSCULAR

## 2018-06-17 MED ORDER — SUGAMMADEX SODIUM 500 MG/5ML IV SOLN
INTRAVENOUS | Status: AC
  Filled 2018-06-17: qty 5

## 2018-06-17 MED ORDER — HYDROMORPHONE HCL 1 MG/ML IJ SOLN
INTRAMUSCULAR | Status: AC
  Filled 2018-06-17: qty 0.5

## 2018-06-17 MED ORDER — ROCURONIUM BROMIDE 100 MG/10ML IV SOLN
INTRAVENOUS | Status: DC | PRN
  Administered 2018-06-17: 10 mg via INTRAVENOUS
  Administered 2018-06-17: 50 mg via INTRAVENOUS

## 2018-06-17 MED ORDER — SODIUM CHLORIDE 0.9 % IJ SOLN
INTRAMUSCULAR | Status: AC
  Filled 2018-06-17: qty 100

## 2018-06-17 MED ORDER — BUPIVACAINE LIPOSOME 1.3 % IJ SUSP
20.0000 mL | Freq: Once | INTRAMUSCULAR | Status: AC
  Administered 2018-06-17: 20 mL
  Filled 2018-06-17: qty 20

## 2018-06-17 MED ORDER — HYDROMORPHONE HCL 1 MG/ML IJ SOLN
INTRAMUSCULAR | Status: DC | PRN
  Administered 2018-06-17: 1 mg via INTRAVENOUS

## 2018-06-17 MED ORDER — SCOPOLAMINE 1 MG/3DAYS TD PT72
1.0000 | MEDICATED_PATCH | Freq: Once | TRANSDERMAL | Status: DC
  Administered 2018-06-17: 1.5 mg via TRANSDERMAL

## 2018-06-17 MED ORDER — CEFAZOLIN SODIUM-DEXTROSE 2-4 GM/100ML-% IV SOLN
2.0000 g | INTRAVENOUS | Status: AC
  Administered 2018-06-17: 2 g via INTRAVENOUS

## 2018-06-17 MED ORDER — NALOXONE HCL 0.4 MG/ML IJ SOLN: 0.4000 mg | INTRAMUSCULAR | Status: DC | PRN

## 2018-06-17 MED ORDER — KETOROLAC TROMETHAMINE 30 MG/ML IJ SOLN
INTRAMUSCULAR | Status: AC
  Filled 2018-06-17: qty 1

## 2018-06-17 MED ORDER — LIDOCAINE HCL (CARDIAC) PF 100 MG/5ML IV SOSY
PREFILLED_SYRINGE | INTRAVENOUS | Status: AC
  Filled 2018-06-17: qty 5

## 2018-06-17 MED ORDER — INFLUENZA VAC SPLIT QUAD 0.5 ML IM SUSY
0.5000 mL | PREFILLED_SYRINGE | INTRAMUSCULAR | Status: AC
  Administered 2018-06-19: 0.5 mL via INTRAMUSCULAR
  Filled 2018-06-17: qty 0.5

## 2018-06-17 MED ORDER — LACTATED RINGERS IV SOLN
INTRAVENOUS | Status: DC
  Administered 2018-06-17: 125 mL/h via INTRAVENOUS
  Administered 2018-06-17 (×2): via INTRAVENOUS

## 2018-06-17 MED ORDER — MIDAZOLAM HCL 2 MG/2ML IJ SOLN
INTRAMUSCULAR | Status: AC
  Filled 2018-06-17: qty 2

## 2018-06-17 MED ORDER — DEXAMETHASONE SODIUM PHOSPHATE 10 MG/ML IJ SOLN
INTRAMUSCULAR | Status: DC | PRN
  Administered 2018-06-17: 10 mg via INTRAVENOUS

## 2018-06-17 MED ORDER — OXYCODONE HCL 5 MG/5ML PO SOLN: 5.0000 mg | Freq: Once | ORAL | Status: DC | PRN

## 2018-06-17 MED ORDER — FENTANYL CITRATE (PF) 250 MCG/5ML IJ SOLN
INTRAMUSCULAR | Status: AC
  Filled 2018-06-17: qty 5

## 2018-06-17 MED ORDER — KETOROLAC TROMETHAMINE 30 MG/ML IJ SOLN
INTRAMUSCULAR | Status: DC | PRN
  Administered 2018-06-17: 30 mg via INTRAVENOUS

## 2018-06-17 MED ORDER — HYDROMORPHONE HCL 1 MG/ML IJ SOLN
0.2500 mg | INTRAMUSCULAR | Status: DC | PRN
  Administered 2018-06-17 (×3): 0.5 mg via INTRAVENOUS

## 2018-06-17 SURGICAL SUPPLY — 51 items
BARRIER ADHS 3X4 INTERCEED (GAUZE/BANDAGES/DRESSINGS) ×4 IMPLANT
CANISTER SUCT 3000ML PPV (MISCELLANEOUS) ×2 IMPLANT
CATH FOLEY 2WAY  3CC  8FR (CATHETERS)
CATH FOLEY 2WAY 3CC 8FR (CATHETERS) IMPLANT
CELLS DAT CNTRL 66122 CELL SVR (MISCELLANEOUS) ×1 IMPLANT
CONT PATH 16OZ SNAP LID 3702 (MISCELLANEOUS) IMPLANT
DECANTER SPIKE VIAL GLASS SM (MISCELLANEOUS) ×4 IMPLANT
DERMABOND ADVANCED (GAUZE/BANDAGES/DRESSINGS) ×1
DERMABOND ADVANCED .7 DNX12 (GAUZE/BANDAGES/DRESSINGS) ×1 IMPLANT
DRAPE CESAREAN BIRTH W POUCH (DRAPES) ×2 IMPLANT
DRSG OPSITE POSTOP 4X10 (GAUZE/BANDAGES/DRESSINGS) ×2 IMPLANT
ELECT NEEDLE TIP 2.8 STRL (NEEDLE) ×2 IMPLANT
FILTER STRAW FLUID ASPIR (MISCELLANEOUS) IMPLANT
GAUZE 4X4 16PLY RFD (DISPOSABLE) ×2 IMPLANT
GAUZE SPONGE 4X4 12PLY STRL LF (GAUZE/BANDAGES/DRESSINGS) IMPLANT
GLOVE BIO SURGEON STRL SZ7.5 (GLOVE) ×2 IMPLANT
GLOVE BIOGEL PI IND STRL 7.0 (GLOVE) ×1 IMPLANT
GLOVE BIOGEL PI INDICATOR 7.0 (GLOVE) ×1
GOWN STRL REUS W/TWL LRG LVL3 (GOWN DISPOSABLE) ×6 IMPLANT
IV STOPCOCK 4 WAY 40  W/Y SET (IV SOLUTION)
IV STOPCOCK 4 WAY 40 W/Y SET (IV SOLUTION) IMPLANT
NEEDLE HYPO 22GX1.5 SAFETY (NEEDLE) ×2 IMPLANT
NS IRRIG 1000ML POUR BTL (IV SOLUTION) ×2 IMPLANT
PACK ABDOMINAL GYN (CUSTOM PROCEDURE TRAY) ×2 IMPLANT
PAD OB MATERNITY 4.3X12.25 (PERSONAL CARE ITEMS) ×2 IMPLANT
PROTECTOR NERVE ULNAR (MISCELLANEOUS) ×2 IMPLANT
RTRCTR C-SECT PINK 25CM LRG (MISCELLANEOUS) ×2 IMPLANT
RTRCTR WOUND ALEXIS 18CM MED (MISCELLANEOUS) ×2
SHEET LAVH (DRAPES) IMPLANT
SPONGE LAP 18X18 X RAY DECT (DISPOSABLE) ×2 IMPLANT
STAPLER VISISTAT 35W (STAPLE) IMPLANT
SUT CHROMIC 2 0 SH (SUTURE) IMPLANT
SUT MNCRL AB 3-0 PS2 27 (SUTURE) ×2 IMPLANT
SUT MON AB 2-0 CT1 36 (SUTURE) ×4 IMPLANT
SUT MON AB-0 CT1 36 (SUTURE) ×4 IMPLANT
SUT PLAIN 2 0 (SUTURE) ×2
SUT PLAIN ABS 2-0 54XMFL TIE (SUTURE) ×1 IMPLANT
SUT PLAIN ABS 2-0 CT1 27XMFL (SUTURE) ×1 IMPLANT
SUT VIC AB 0 CT1 18XCR BRD8 (SUTURE) ×2 IMPLANT
SUT VIC AB 0 CT1 8-18 (SUTURE) ×2
SUT VIC AB 2-0 CT1 27 (SUTURE) ×1
SUT VIC AB 2-0 CT1 TAPERPNT 27 (SUTURE) ×1 IMPLANT
SUT VIC AB 2-0 SH 27 (SUTURE)
SUT VIC AB 2-0 SH 27XBRD (SUTURE) IMPLANT
SUT VIC AB 4-0 SH 27 (SUTURE)
SUT VIC AB 4-0 SH 27XANBCTRL (SUTURE) IMPLANT
SUT VICRYL 1 TIES 12X18 (SUTURE) ×2 IMPLANT
SYR 50ML LL SCALE MARK (SYRINGE) IMPLANT
SYR CONTROL 10ML LL (SYRINGE) ×2 IMPLANT
TOWEL OR 17X24 6PK STRL BLUE (TOWEL DISPOSABLE) ×4 IMPLANT
TRAY FOLEY W/BAG SLVR 14FR (SET/KITS/TRAYS/PACK) ×2 IMPLANT

## 2018-06-17 NOTE — Transfer of Care (Signed)
Immediate Anesthesia Transfer of Care Note  Patient: Mary Brennan  Procedure(s) Performed: Abdominal MYOMECTOMY (N/A Abdomen)  Patient Location: PACU  Anesthesia Type:General  Level of Consciousness: awake, alert  and oriented  Airway & Oxygen Therapy: Patient Spontanous Breathing and Patient connected to nasal cannula oxygen  Post-op Assessment: Report given to RN and Post -op Vital signs reviewed and stable  Post vital signs: Reviewed and stable  Last Vitals:  Vitals Value Taken Time  BP    Temp    Pulse    Resp    SpO2      Last Pain:  Vitals:   06/17/18 1125  TempSrc: Oral      Patients Stated Pain Goal: 5 (15/72/62 0355)  Complications: No apparent anesthesia complications

## 2018-06-17 NOTE — Anesthesia Preprocedure Evaluation (Signed)
Anesthesia Evaluation  Patient identified by MRN, date of birth, ID band Patient awake    Reviewed: Allergy & Precautions, NPO status , Patient's Chart, lab work & pertinent test results  Airway Mallampati: II  TM Distance: >3 FB Neck ROM: Full    Dental no notable dental hx.    Pulmonary neg pulmonary ROS,    Pulmonary exam normal breath sounds clear to auscultation       Cardiovascular hypertension, negative cardio ROS Normal cardiovascular exam Rhythm:Regular Rate:Normal     Neuro/Psych  Headaches, Anxiety negative neurological ROS  negative psych ROS   GI/Hepatic negative GI ROS, Neg liver ROS,   Endo/Other  negative endocrine ROSMorbid obesity  Renal/GU negative Renal ROS  negative genitourinary   Musculoskeletal negative musculoskeletal ROS (+)   Abdominal (+) + obese,   Peds negative pediatric ROS (+)  Hematology negative hematology ROS (+)   Anesthesia Other Findings   Reproductive/Obstetrics negative OB ROS                             Anesthesia Physical Anesthesia Plan  ASA: III  Anesthesia Plan: General   Post-op Pain Management:    Induction: Intravenous  PONV Risk Score and Plan: 3 and Ondansetron, Dexamethasone and Midazolam  Airway Management Planned: Oral ETT  Additional Equipment:   Intra-op Plan:   Post-operative Plan: Extubation in OR  Informed Consent: I have reviewed the patients History and Physical, chart, labs and discussed the procedure including the risks, benefits and alternatives for the proposed anesthesia with the patient or authorized representative who has indicated his/her understanding and acceptance.   Dental advisory given  Plan Discussed with: CRNA  Anesthesia Plan Comments:         Anesthesia Quick Evaluation

## 2018-06-17 NOTE — H&P (Signed)
Mary Brennan is an 32 y.o. female. Symptomatic fibroids for myomectomy. Inc pain and bleeding.  Pertinent Gynecological History: Menses: with severe dysmenorrhea Bleeding: dysfunctional uterine bleeding Contraception: none DES exposure: denies Blood transfusions: none Sexually transmitted diseases: no past history Previous GYN Procedures: na  Last mammogram: na Date: na Last pap: normal Date: 2019 OB History: G0, P0   Menstrual History: Menarche age: 73 No LMP recorded.    Past Medical History:  Diagnosis Date  . Anemia   . Anxiety   . Blood dyscrasia   . Headache   . Hypertension    AT AGE 79 NONE NOW    History reviewed. No pertinent surgical history.  History reviewed. No pertinent family history.  Social History:  reports that she has never smoked. She has never used smokeless tobacco. She reports that she does not drink alcohol or use drugs.  Allergies: No Known Allergies  Medications Prior to Admission  Medication Sig Dispense Refill Last Dose  . cholecalciferol (VITAMIN D) 1000 units tablet Take 1,000 Units by mouth daily.   Past Month at Unknown time  . folic acid (FOLVITE) 833 MCG tablet Take 400 mcg by mouth daily.   Past Month at Unknown time  . Multiple Vitamins-Minerals (MULTIVITAMIN WITH MINERALS) tablet Take 1 tablet by mouth daily.   Past Month at Unknown time    Review of Systems  Constitutional: Negative.   All other systems reviewed and are negative.   Blood pressure (!) 134/93, pulse 74, temperature 98.1 F (36.7 C), temperature source Oral, resp. rate 16, SpO2 99 %. Physical Exam  Nursing note and vitals reviewed. Constitutional: She is oriented to person, place, and time. She appears well-developed and well-nourished.  HENT:  Head: Normocephalic and atraumatic.  Neck: Normal range of motion. Neck supple.  Cardiovascular: Normal rate and regular rhythm.  Respiratory: Effort normal and breath sounds normal.  Genitourinary: Vagina normal  and uterus normal.  Musculoskeletal: Normal range of motion.  Neurological: She is oriented to person, place, and time. She has normal reflexes.  Skin: Skin is warm and dry.  Psychiatric: She has a normal mood and affect.    Results for orders placed or performed during the hospital encounter of 06/17/18 (from the past 24 hour(s))  hCG, serum, qualitative     Status: None   Collection Time: 06/17/18 11:17 AM  Result Value Ref Range   Preg, Serum NEGATIVE NEGATIVE    No results found.  Assessment/Plan: Symptomatic fibroids Abdominal myomectomy Consent done.  Mary Brennan J 06/17/2018, 12:46 PM

## 2018-06-17 NOTE — Op Note (Signed)
Mary Brennan, ANASTOS MEDICAL RECORD [id number] ACCOUNT [id number] DATE OF BIRTH:23-Oct-1985 FACILITY: [facility] LOCATION: [id number] PHYSICIAN:Aiyana Stegmann J. Mayar Whittier, MD  OPERATIVE REPORT  DATE OF PROCEDURE:  06/17/2018  PREOPERATIVE DIAGNOSES:  Symptomatic uterine fibroids, dysmenorrhea and menorrhagia  POSTOPERATIVE DIAGNOSES:   Large subserosal and pedunculated fibroids, pelvic adhesions in the posterior cul-de-sac.  PROCEDURE:  Abdominal myomectomy, lysis of adhesions.  SURGEON:  Brien Few, MD Assist: Murrell Redden, MD  ESTIMATED BLOOD LOSS:  200 mL.  COMPLICATIONS:  None.  DRAINS:  Foley.  COUNTS:  Correct.  DISPOSITION:  The patient to recovery in good condition.  BRIEF OPERATIVE NOTE:  After being apprised of the risks of anesthesia, infection, bleeding and surrounding organs, possible need for repair, possible formation of scar tissue with possible need for reoperation, probable need for C-section with pursuing  pregnancy and/or possible small risk for hysterectomy due to inability to remove the fibroids.  The patient's consent is signed.  DESCRIPTION OF PROCEDURE:  She was brought to the operating room where she was placed in dorsal lithotomy position, prepped and draped in usual sterile fashion.  Foley catheter was placed.  Exam under anesthesia reveals an 18-week size uterus and an  irregular anterior mass consistent with fibroid uterus.  At this time, a large Pfannenstiel skin incision was made, carried down to the fascia, which was nicked in the midline and opened transversely using Mayo scissors.  Rectus muscles dissected sharply  in the midline.  Peritoneum entered sharply.  Uterus was delivered through the incision.  The anterior pedunculated fibroid was infiltrated at the base using dilute Pitressin solution and removed at its base.  The posterior fibroid is a large subserosal  fibroid occupying interface and the entire back wall, clear of the tubes and ovaries  bilaterally.  There were adhesions in the cul-de-sac, which were lysed sharply and bluntly in order to elevate the uterus out of the cul-de-sac.  At this time, the base  of the fibroid was infiltrated using a dilute Pitressin solution and circumferentially scored using the needlepoint cautery fibroid was then undermined and extricated at its base using sharp and blunt dissection.  The defect along the back wall of the  uterus does not involve the posterior cul-de-sac, but involves the entire posterior wall without evidence of the broad ligament infiltration.  At this time, the defect was closed using 2-0 Vicryl in an interrupted fashion and due to the jagged nature of  the defect, interrupted Vicryl sutures were used to close the defect and serosally as well.  At this time, an incision was made over the right broad ligament where there is a large interligamentous fibroid that is extruded through this incision and  infiltrated at the base and excised along the anterior myometrial wall.  At this time,  the defect was also closed using 0 Vicryl suture and a 2-0 Monocryl was used to close the serosa in a continuous running fashion.  A 2-0 Monocryl suture was also used  to close the defect and removal of pedunculated fibroid.  Normal tubes and ovaries were noted.  Interceed was placed on the anterior and posterior wall of the uterus.  Normal tubes and ovaries noted.  Minimal bleeding noted.  Uterus was replaced in the  abdominal cavity.  The fascia was then closed after irrigation was accomplished using 0 Monocryl in continuous running fashion.  A 2-0 plain used to close the subcutaneous tissue and a 3-0 Monocryl to close the skin, which was infiltrated with dilute  Exparel and  Marcaine solution and Dermabond was placed.    The patient tolerated the procedure well, is awakened and transferred to recovery in good condition.  AN/NUANCE  D:06/17/2018 T:06/17/2018 JOB:[id number]

## 2018-06-17 NOTE — Anesthesia Postprocedure Evaluation (Signed)
Anesthesia Post Note  Patient: Mary Brennan  Procedure(s) Performed: Abdominal MYOMECTOMY (N/A Abdomen)     Patient location during evaluation: PACU Anesthesia Type: General Level of consciousness: awake and alert Pain management: pain level controlled Vital Signs Assessment: post-procedure vital signs reviewed and stable Respiratory status: spontaneous breathing, nonlabored ventilation and respiratory function stable Cardiovascular status: blood pressure returned to baseline and stable Postop Assessment: no apparent nausea or vomiting Anesthetic complications: no    Last Vitals:  Vitals:   06/17/18 1615 06/17/18 1639  BP: 105/66   Pulse: 71   Resp: (!) 9 11  Temp:    SpO2: 99% 99%    Last Pain:  Vitals:   06/17/18 1639  TempSrc:   PainSc: Asleep   Pain Goal: Patients Stated Pain Goal: 5 (06/17/18 1125)               Lynda Rainwater

## 2018-06-17 NOTE — Op Note (Signed)
06/17/2018  2:42 PM  PATIENT:  Danae Chen N Dunsworth  32 y.o. female  PRE-OPERATIVE DIAGNOSIS:  Symptomatic Fibroids  POST-OPERATIVE DIAGNOSIS:  Symptomatic Fibroids  PROCEDURE:  Procedure(s): Abdominal MYOMECTOMY LYSIS OF ADHESIONS  SURGEON:  Surgeon(s): Brien Few, MD Murrell Redden Earlyne Iba, MD  ASSISTANTS: Murrell Redden, MD   ANESTHESIA:   local and general  ESTIMATED BLOOD LOSS: 200 mL   DRAINS: Urinary Catheter (Foley)   LOCAL MEDICATIONS USED:  Amount: 30 EXPAREL ml  SPECIMEN:  Source of Specimen:  MULTIPLE FIBROIDS, 800 + GMS  DISPOSITION OF SPECIMEN:  PATHOLOGY  COUNTS:  YES  DICTATION #: [id number]  PLAN OF CARE: DC AM  PATIENT DISPOSITION:  PACU - hemodynamically stable.

## 2018-06-17 NOTE — Progress Notes (Signed)
Patient ID: Mary Brennan, female   DOB: 1986/06/26, 32 y.o.   MRN: [id number] Patient seen and examined. Consent witnessed and signed. No changes noted. Update completed.Patient seen and examined. Consent witnessed and signed. No changes noted. Update completed.

## 2018-06-17 NOTE — Anesthesia Procedure Notes (Signed)
Procedure Name: Intubation Date/Time: 06/17/2018 1:12 PM Performed by: Riki Sheer, CRNA Pre-anesthesia Checklist: Patient identified, Suction available, Emergency Drugs available, Patient being monitored and Timeout performed Patient Re-evaluated:Patient Re-evaluated prior to induction Oxygen Delivery Method: Circle system utilized Preoxygenation: Pre-oxygenation with 100% oxygen Induction Type: IV induction Ventilation: Mask ventilation without difficulty Laryngoscope Size: Miller and 2 Grade View: Grade I Tube size: 7.0 mm Number of attempts: 1 Airway Equipment and Method: Stylet Placement Confirmation: ETT inserted through vocal cords under direct vision,  positive ETCO2,  CO2 detector and breath sounds checked- equal and bilateral Secured at: 24 cm Tube secured with: Tape Dental Injury: Teeth and Oropharynx as per pre-operative assessment

## 2018-06-18 ENCOUNTER — Encounter (HOSPITAL_COMMUNITY): Payer: Self-pay | Admitting: Obstetrics and Gynecology

## 2018-06-18 LAB — BASIC METABOLIC PANEL
ANION GAP: 8 (ref 5–15)
BUN: 10 mg/dL (ref 6–20)
CALCIUM: 8.4 mg/dL — AB (ref 8.9–10.3)
CO2: 24 mmol/L (ref 22–32)
CREATININE: 0.88 mg/dL (ref 0.44–1.00)
Chloride: 104 mmol/L (ref 98–111)
GFR calc non Af Amer: >60 mL/min (ref >60)
Glucose, Bld: 112 mg/dL — ABNORMAL HIGH (ref 70–99)
Potassium: 4.1 mmol/L (ref 3.5–5.1)
Sodium: 136 mmol/L (ref 135–145)

## 2018-06-18 LAB — CBC
HEMATOCRIT: 34.4 % — AB (ref 36.0–46.0)
Hemoglobin: 11.7 g/dL — ABNORMAL LOW (ref 12.0–15.0)
MCH: 30 pg (ref 26.0–34.0)
MCHC: 34 g/dL (ref 30.0–36.0)
MCV: 88.2 fL (ref 78.0–100.0)
PLATELETS: 269 10*3/uL (ref 150–400)
RBC: 3.9 MIL/uL (ref 3.87–5.11)
RDW: 12.9 % (ref 11.5–15.5)
WBC: 11 10*3/uL — AB (ref 4.0–10.5)

## 2018-06-18 NOTE — Progress Notes (Signed)
1 Day Post-Op Procedure(s) (LRB): Abdominal MYOMECTOMY (N/A)  Subjective: Patient reports nausea, incisional pain, tolerating PO, + flatus and no problems voiding.    Objective: BP 118/69 (BP Location: Right Arm)   Pulse 67   Temp 99.7 F (37.6 C) (Oral)   Resp (!) 21   Ht 5\' 4"  (1.626 m)   Wt 105.8 kg   SpO2 97%   BMI 40.04 kg/m   CBC    Component Value Date/Time   WBC 11.0 (H) 06/18/2018 0559   RBC 3.90 06/18/2018 0559   HGB 11.7 (L) 06/18/2018 0559   HCT 34.4 (L) 06/18/2018 0559   PLT 269 06/18/2018 0559   MCV 88.2 06/18/2018 0559   MCH 30.0 06/18/2018 0559   MCHC 34.0 06/18/2018 0559   RDW 12.9 06/18/2018 0559   LYMPHSABS 3.4 09/11/2014 2051   MONOABS 0.6 09/11/2014 2051   EOSABS 0.3 09/11/2014 2051   BASOSABS 0.0 09/11/2014 2051    I have reviewed patient's vital signs, intake and output, medications and labs.  General: alert, cooperative and appears stated age Resp: clear to auscultation bilaterally and normal percussion bilaterally Cardio: regular rate and rhythm, S1, S2 normal, no murmur, click, rub or gallop GI: soft, non-tender; bowel sounds normal; no masses,  no organomegaly and incision: clean, dry and intact Extremities: extremities normal, atraumatic, no cyanosis or edema and Homans sign is negative, no sign of DVT Vaginal Bleeding: minimal  Assessment: s/p Procedure(s): Abdominal MYOMECTOMY (N/A): stable, progressing well and tolerating diet  Plan: Advance diet Encourage ambulation Advance to PO medication Discontinue IV fluids  LOS: 1 day    Triston Skare J 06/18/2018, 6:43 AM

## 2018-06-19 MED ORDER — TRAMADOL HCL 50 MG PO TABS: 50.0000 mg | ORAL_TABLET | Freq: Four times a day (QID) | ORAL | 0 refills | Status: AC | PRN

## 2018-06-19 MED ORDER — HYDROCODONE-ACETAMINOPHEN 5-325 MG PO TABS: 1.0000 | ORAL_TABLET | Freq: Four times a day (QID) | ORAL | 0 refills | Status: AC | PRN

## 2018-06-19 NOTE — Progress Notes (Signed)
2 Days Post-Op Procedure(s) (LRB): Abdominal MYOMECTOMY (N/A)  Subjective: Patient reports nausea, incisional pain, tolerating PO, + flatus and no problems voiding.    Objective: BP (!) 97/57 (BP Location: Right Arm)   Pulse 62   Temp 98.7 F (37.1 C) (Oral)   Resp 20   Ht 5\' 4"  (1.626 m)   Wt 105.8 kg   SpO2 100%   BMI 40.04 kg/m   CBC    Component Value Date/Time   WBC 11.0 (H) 06/18/2018 0559   RBC 3.90 06/18/2018 0559   HGB 11.7 (L) 06/18/2018 0559   HCT 34.4 (L) 06/18/2018 0559   PLT 269 06/18/2018 0559   MCV 88.2 06/18/2018 0559   MCH 30.0 06/18/2018 0559   MCHC 34.0 06/18/2018 0559   RDW 12.9 06/18/2018 0559   LYMPHSABS 3.4 09/11/2014 2051   MONOABS 0.6 09/11/2014 2051   EOSABS 0.3 09/11/2014 2051   BASOSABS 0.0 09/11/2014 2051    I have reviewed patient's vital signs, intake and output, medications and labs.  General: alert, cooperative and appears stated age Resp: clear to auscultation bilaterally and normal percussion bilaterally Cardio: regular rate and rhythm, S1, S2 normal, no murmur, click, rub or gallop GI: soft, non-tender; bowel sounds normal; no masses,  no organomegaly and incision: clean, dry and intact Extremities: extremities normal, atraumatic, no cyanosis or edema and Homans sign is negative, no sign of DVT Vaginal Bleeding: minimal  Assessment: s/p Procedure(s): Abdominal MYOMECTOMY (N/A): stable, progressing well and tolerating diet  Plan: Advance diet Encourage ambulation Advance to PO medication Discontinue IV fluids  DC home today  LOS: 2 days    Jozee Hammer J 06/19/2018, 5:58 AM

## 2018-06-19 NOTE — Progress Notes (Signed)
Pt teaching complete out in wheelchair

## 2018-06-24 NOTE — Discharge Summary (Signed)
Mary Brennan, DURFLINGER MEDICAL RECORD [id number] ACCOUNT [id number] DATE OF BIRTH:11/13/1985 FACILITY: [facility] LOCATION: [id number] PHYSICIAN:Veronique Warga J. Ronita Hipps, MD  DISCHARGE SUMMARY  DATE OF DISCHARGE:  06/19/2018  ADMISSION:  06/17/2018.  DISCHARGE:  06/19/2018.  HOSPITAL COURSE:  The patient underwent uncomplicated multiple abdominal myomectomy on 06/17/2018.  Postoperatively, she did well, tolerated regular diet well.  Hemoglobin and hematocrit within normal limits.  Ambulated without difficulty.  She was  discharged home with adequate pain control and a well-healing incision on postoperative day #2.  Discharge teaching was done.  DISCHARGE MEDICATIONS:  Include tramadol, oxycodone.  She is to follow up in the office within 2 weeks.  PN/NUANCE D:06/23/2018 T:06/23/2018 JOB:[id number]

## 2018-11-11 DIAGNOSIS — N644 Mastodynia: Secondary | ICD-10-CM | POA: Diagnosis not present

## 2020-11-30 ENCOUNTER — Encounter: Payer: Self-pay | Admitting: Family Medicine

## 2020-11-30 ENCOUNTER — Telehealth: Payer: Commercial Managed Care - PPO | Admitting: Family Medicine

## 2020-11-30 DIAGNOSIS — M25532 Pain in left wrist: Secondary | ICD-10-CM

## 2020-11-30 DIAGNOSIS — M25531 Pain in right wrist: Secondary | ICD-10-CM | POA: Insufficient documentation

## 2020-11-30 NOTE — Progress Notes (Signed)
[name], mceachern are scheduled for a virtual visit with your provider today.    Just as we do with appointments in the office, we must obtain your consent to participate.  Your consent will be active for this visit and any virtual visit you may have with one of our providers in the next 365 days.    If you have a MyChart account, I can also send a copy of this consent to you electronically.  All virtual visits are billed to your insurance company just like a traditional visit in the office.  As this is a virtual visit, video technology does not allow for your provider to perform a traditional examination.  This may limit your provider's ability to fully assess your condition.  If your provider identifies any concerns that need to be evaluated in person or the need to arrange testing such as labs, EKG, etc, we will make arrangements to do so.    Although advances in technology are sophisticated, we cannot ensure that it will always work on either your end or our end.  If the connection with a video visit is poor, we may have to switch to a telephone visit.  With either a video or telephone visit, we are not always able to ensure that we have a secure connection.   I need to obtain your verbal consent now.   Are you willing to proceed with your visit today?   [name] has provided verbal consent on 11/30/2020 for a virtual visit (video or telephone).   Perlie Mayo, NP 11/30/2020  12:31 PM   Date:  11/30/2020   ID:  Carlynn Purl Manfre, DOB May 01, 1986, MRN [id number]  Patient Location: Home Provider Location: Home Office   Participants: Patient and Provider for Visit and Wrap up  Method of visit: Video  Location of Patient: Home Location of Provider: Home Office Consent was obtain for visit over the video. Services rendered by provider: Visit was performed via video  A video enabled telemedicine application was used and I verified that I am speaking with the correct person using two  identifiers.  PCP:  Jake Samples, PA-C   Chief Complaint:  Thumb to wrist pain both sides   History of Present Illness:    Mary Brennan is a 35 y.o. female with history as stated below. Presents video telehealth for an acute care visit secondary to thumb pain that travels down the past the wrist on both sides. She reports it happened after the vaccine- moderna. However in conversation she reports some finger issues as well, and her mother has similar issues, but is not sure what it is. She reports she has not talk to her PCP yet about this concern. Has not been seen for finger issues that are similar and has not really tried much OTC, but what she tried did not help.   Past Medical, Surgical, Social History, Allergies, and Medications have been Reviewed.  Past Medical History:  Diagnosis Date  . Anemia   . Anxiety   . Blood dyscrasia   . Headache   . Hypertension    AT AGE 42 NONE NOW    No outpatient medications have been marked as taking for the 11/30/20 encounter (Video Visit) with Perlie Mayo, NP.     Allergies:   Patient has no known allergies.   ROS See HPI for history of present illness.  Physical Exam Constitutional:      Appearance: Normal appearance.  HENT:  Head: Normocephalic and atraumatic.     Right Ear: External ear normal.     Left Ear: External ear normal.     Nose: Nose normal.  Eyes:     Extraocular Movements: Extraocular movements intact.     Conjunctiva/sclera: Conjunctivae normal.  Musculoskeletal:        General: Normal range of motion.     Cervical back: Normal range of motion.     Comments: No swelling, ROM changes, or palm sensation changes reported or seen  Skin:    General: Skin is warm and dry.  Neurological:     Mental Status: She is alert and oriented to person, place, and time.  Psychiatric:        Mood and Affect: Mood normal.        Behavior: Behavior normal.        Thought Content: Thought content normal.         Judgment: Judgment normal.               A&P  1. Pain in both wrists -advised an in person visit for nerve conduction might be best. -can get referral from PCP -has had previous issues with fingers- might be a carpal tunnel issue -tylenol and NSAID use as needed    Time:   Today, I have spent 10 minutes with the patient with telehealth technology discussing the above problems, reviewing the chart, previous notes, medications and orders.    Tests Ordered: No orders of the defined types were placed in this encounter.   Medication Changes: No orders of the defined types were placed in this encounter.    Disposition:  Follow up  Signed, Perlie Mayo, NP  11/30/2020 12:31 PM

## 2020-11-30 NOTE — Patient Instructions (Signed)
Unsure if vaccine caused the discomfort you have. But a referral to neurology would be a benefit- your PCP can place this for you.  Use of tylenol and ibuprofen can be tried to help until you know what might be going on. Avoid repetitive motions as this can make nerve pain worse.   Hope you feel better soon

## 2021-04-12 ENCOUNTER — Ambulatory Visit
Admission: RE | Admit: 2021-04-12 | Discharge: 2021-04-12 | Disposition: A | Discharge status: Home / Self Care | Payer: 59 | Source: Ambulatory Visit | Attending: Internal Medicine | Admitting: Internal Medicine

## 2021-04-12 ENCOUNTER — Other Ambulatory Visit: Payer: Self-pay | Admitting: Internal Medicine

## 2021-04-12 DIAGNOSIS — R52 Pain, unspecified: Secondary | ICD-10-CM

## 2022-05-20 IMAGING — CR DG FINGER THUMB 2+V*L*
3 series · 3 of 3 positions shown · non-contrast
Comparison: None.

CLINICAL DATA: Bilateral thumb pain, no known injury

EXAM:
LEFT THUMB 2+V

[x finger pa left]
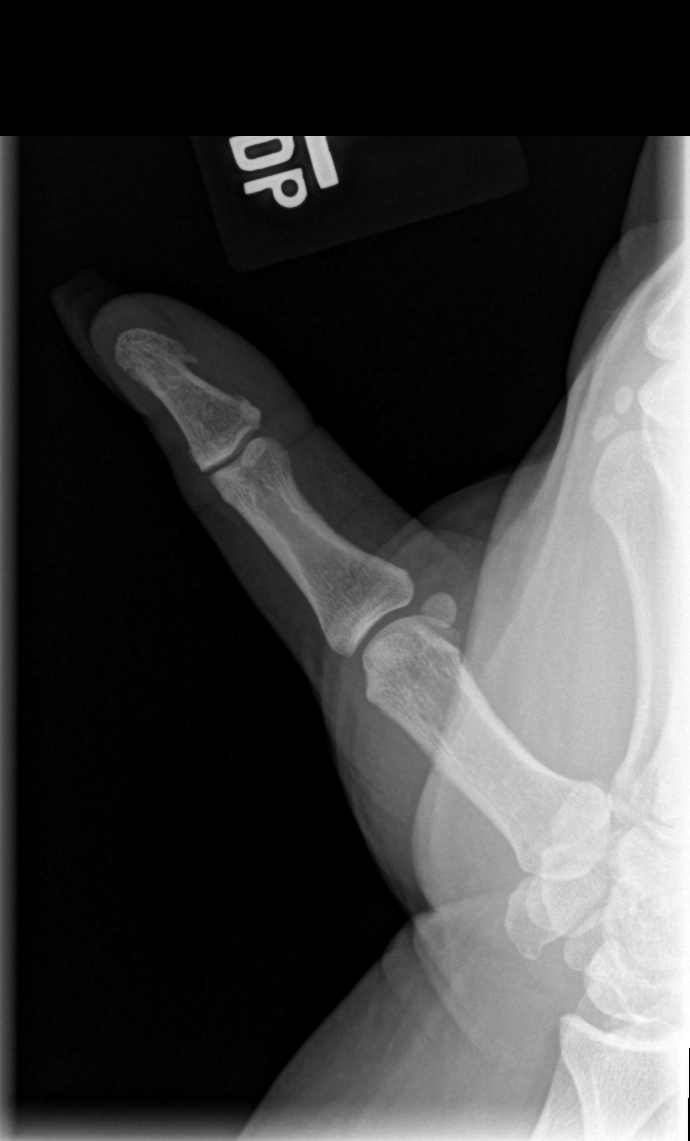

[x finger obl. left]
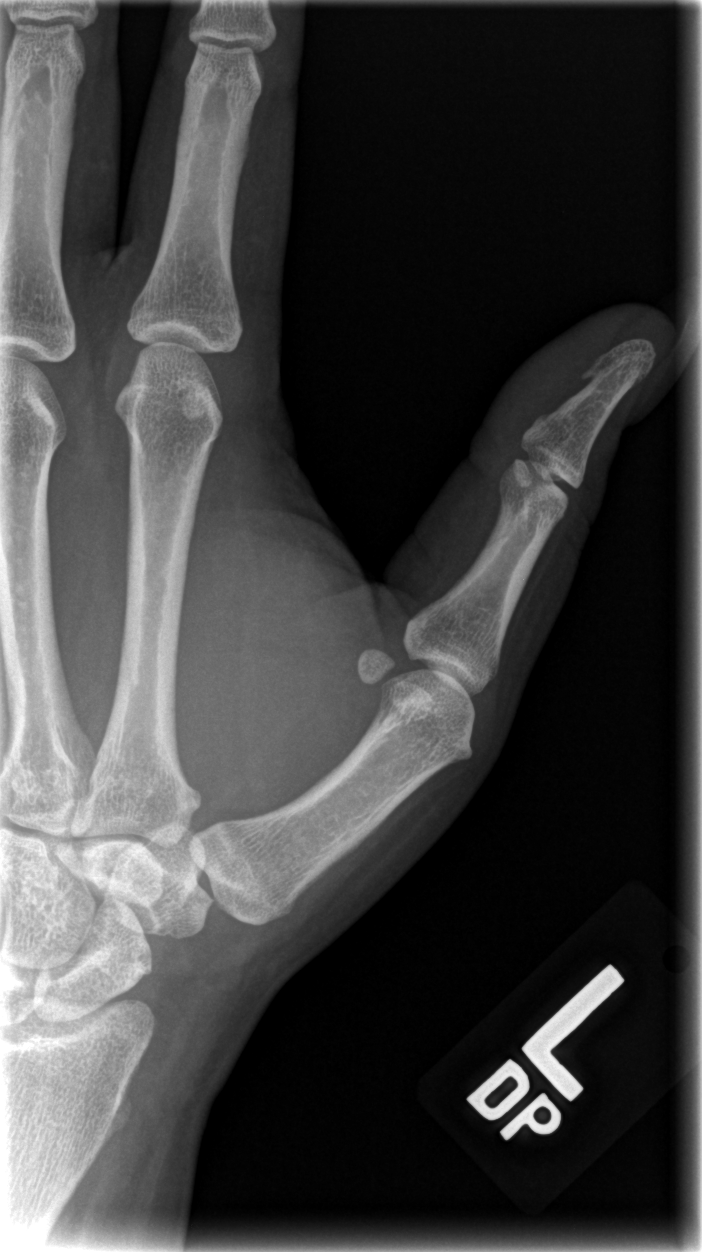

[x finger lateral left]
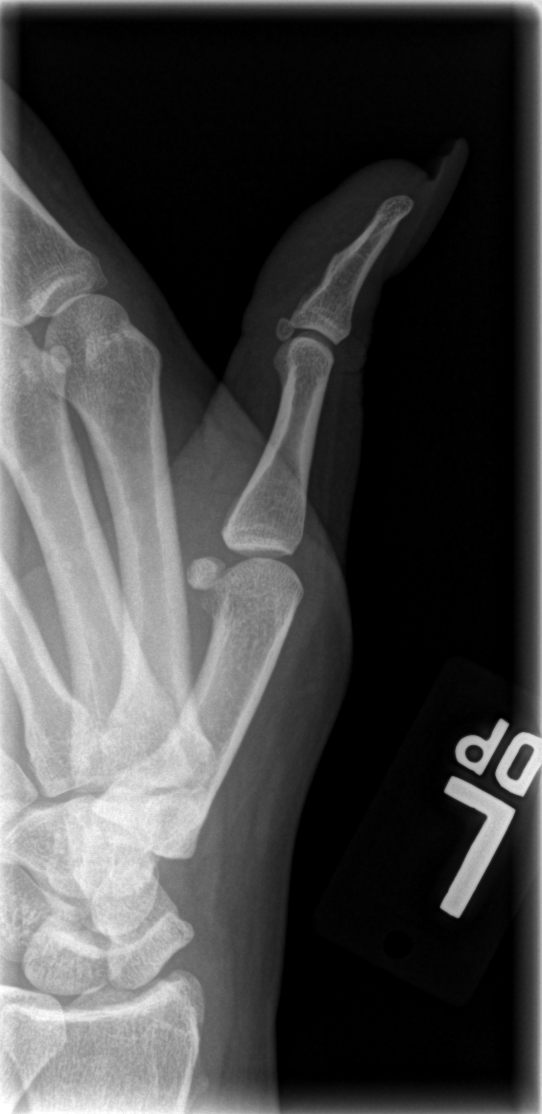

[3 of 3 positions shown; findings below may reference images not displayed]

FINDINGS: There is no evidence of fracture or dislocation. There is no
evidence of arthropathy or other focal bone abnormality. Soft
tissues are unremarkable.
IMPRESSION: No fracture or dislocation of the left thumb. Joint spaces are well
preserved.
